# Patient Record
Sex: Male | Born: 1980 | Race: White | Hispanic: No | Marital: Married | Smoking: Former smoker
Health system: Southern US, Community
[De-identification: ages and names within clinical notes are randomized; demographics above are authoritative.]

## PROBLEM LIST (undated history)

## (undated) DIAGNOSIS — Z9889 Other specified postprocedural states: Secondary | ICD-10-CM

## (undated) DIAGNOSIS — R112 Nausea with vomiting, unspecified: Secondary | ICD-10-CM

## (undated) HISTORY — PX: MYRINGOPLASTY W/ FAT GRAFT: SHX2058

## (undated) HISTORY — PX: OTHER SURGICAL HISTORY: SHX169

---

## 2001-07-11 ENCOUNTER — Emergency Department (HOSPITAL_COMMUNITY): Admission: EM | Admit: 2001-07-11 | Discharge: 2001-07-12 | Payer: Self-pay | Admitting: Emergency Medicine

## 2001-07-11 ENCOUNTER — Encounter: Payer: Self-pay | Admitting: Emergency Medicine

## 2005-05-01 ENCOUNTER — Ambulatory Visit (HOSPITAL_COMMUNITY): Admission: RE | Admit: 2005-05-01 | Discharge: 2005-05-01 | Payer: Self-pay | Admitting: Otolaryngology

## 2013-11-12 ENCOUNTER — Emergency Department (HOSPITAL_COMMUNITY): Payer: BC Managed Care – PPO

## 2013-11-12 ENCOUNTER — Emergency Department (HOSPITAL_COMMUNITY)
Admission: EM | Admit: 2013-11-12 | Discharge: 2013-11-12 | Disposition: A | Discharge status: Home / Self Care | Payer: BC Managed Care – PPO | Attending: Emergency Medicine | Admitting: Emergency Medicine

## 2013-11-12 ENCOUNTER — Encounter (HOSPITAL_COMMUNITY): Payer: Self-pay | Admitting: Emergency Medicine

## 2013-11-12 DIAGNOSIS — Z880 Allergy status to penicillin: Secondary | ICD-10-CM | POA: Insufficient documentation

## 2013-11-12 DIAGNOSIS — Y9389 Activity, other specified: Secondary | ICD-10-CM | POA: Insufficient documentation

## 2013-11-12 DIAGNOSIS — Y9289 Other specified places as the place of occurrence of the external cause: Secondary | ICD-10-CM | POA: Insufficient documentation

## 2013-11-12 DIAGNOSIS — S61209A Unspecified open wound of unspecified finger without damage to nail, initial encounter: Secondary | ICD-10-CM | POA: Insufficient documentation

## 2013-11-12 DIAGNOSIS — W312XXA Contact with powered woodworking and forming machines, initial encounter: Secondary | ICD-10-CM | POA: Insufficient documentation

## 2013-11-12 DIAGNOSIS — S61309A Unspecified open wound of unspecified finger with damage to nail, initial encounter: Secondary | ICD-10-CM

## 2013-11-12 MED ORDER — OXYCODONE-ACETAMINOPHEN 7.5-325 MG PO TABS: 1.0000 | ORAL_TABLET | ORAL | Status: DC | PRN

## 2013-11-12 MED ORDER — CEFAZOLIN SODIUM 1 G IJ SOLR
1.0000 g | Freq: Once | INTRAMUSCULAR | Status: AC
  Administered 2013-11-12: 1 g via INTRAMUSCULAR
  Filled 2013-11-12: qty 10

## 2013-11-12 MED ORDER — CEPHALEXIN 500 MG PO CAPS: 500.0000 mg | ORAL_CAPSULE | Freq: Four times a day (QID) | ORAL | Status: DC

## 2013-11-12 MED ORDER — STERILE WATER FOR INJECTION IJ SOLN
INTRAMUSCULAR | Status: AC
  Administered 2013-11-12: 2.1 mL
  Filled 2013-11-12: qty 10

## 2013-11-12 MED ORDER — CEFAZOLIN SODIUM 1-5 GM-% IV SOLN: 1.0000 g | Freq: Once | INTRAVENOUS | Status: DC

## 2013-11-12 MED ORDER — LIDOCAINE HCL (PF) 1 % IJ SOLN
INTRAMUSCULAR | Status: DC
Start: 2013-11-12 — End: 2013-11-12
  Filled 2013-11-12: qty 5

## 2013-11-12 MED ORDER — TETANUS-DIPHTH-ACELL PERTUSSIS 5-2.5-18.5 LF-MCG/0.5 IM SUSP
0.5000 mL | Freq: Once | INTRAMUSCULAR | Status: DC
  Filled 2013-11-12: qty 0.5

## 2013-11-12 NOTE — ED Provider Notes (Signed)
CSN: [id number]     Arrival date & time 11/12/13  1404 History   First MD Initiated Contact with Patient 11/12/13 1416     Chief Complaint  Patient presents with  . Finger Injury     (Consider location/radiation/quality/duration/timing/severity/associated sxs/prior Treatment) Patient is a 33 y.o. male presenting with hand injury. The history is provided by the patient.  Hand Injury Location:  Finger Time since incident:  1 hour Injury: yes   Mechanism of injury comment:  Table saw Finger location:  L index finger Pain details:    Quality:  Dull   Radiates to:  Does not radiate   Severity:  Moderate   Onset quality:  Sudden   Timing:  Constant   Progression:  Unchanged Chronicity:  New Handedness:  Left-handed Dislocation: no   Foreign body present:  Unable to specify Tetanus status:  Up to date Prior injury to area:  No Relieved by:  None tried Worsened by:  Nothing tried Ineffective treatments:  None tried  Devin Vaughan is a 33 y.o. male who presents to the ED with a finger injury. He was using a table saw and his hand slipped and the saw blade caught his left index finger at the tip. Part of the nail was injured. He is left hand dominant. He denies any other injuries. He feels that he is up to date on his tetanus and declined update today.   History reviewed. No pertinent past medical history. Past Surgical History  Procedure Laterality Date  . Myringoplasty w/ fat graft     History reviewed. No pertinent family history. History  Substance Use Topics  . Smoking status: Never Smoker   . Smokeless tobacco: Not on file  . Alcohol Use: No    Review of Systems Negative except as stated in HPI   Allergies  Penicillins  Home Medications   Prior to Admission medications   Not on File   BP 112/62  Pulse 73  Temp(Src) 98.1 F (36.7 C) (Oral)  Resp 17  SpO2 98% Physical Exam  Nursing note and vitals reviewed. Constitutional: He is oriented to person,  place, and time. He appears well-developed and well-nourished.  HENT:  Head: Normocephalic and atraumatic.  Eyes: EOM are normal.  Neck: Neck supple.  Cardiovascular: Normal rate.   Pulmonary/Chest: Effort normal.  Musculoskeletal:       Left hand: He exhibits deformity, laceration and swelling.       Hands: Left index finger with avulsion laceration and partial nail avulsion. Bleeding controlled with pressure. Tender on palpation.   Neurological: He is alert and oriented to person, place, and time. No cranial nerve deficit.  Skin: Skin is warm and dry.  Psychiatric: He has a normal mood and affect. His behavior is normal.    ED Course  Procedures (including critical care time) Verbal consent obtained from the patient.  Discussed procedure risk and benefits  Digital block: Cleaned finger with betadine, using Sensorcaine 0.5 and lidocaine 1% without epi. Digital block of the left index finger with good results.   Wound cleaned with betadine and irrigated with NSS, Jagged skin areas trimmed and edges aligned.   Sutured with 5-0 prolene x 3 sutures to help stop bleeding and to align skin edges.  Technique: Interrupted   Patient toleratedthe procedure without any immediate complications.   Labs Review Labs Reviewed - No data to display Dg Finger Index Left  11/12/2013   CLINICAL DATA:  Skin laceration  EXAM: LEFT INDEX FINGER  2+V  COMPARISON:  None.  FINDINGS: There is no evidence of fracture or dislocation. There is no evidence of arthropathy or other focal bone abnormality. Skin laceration at the distal tip of the index finger.  IMPRESSION: No acute osseous injury of the left index finger.   Electronically Signed   By: Elige KoHetal  Patel   On: 11/12/2013 15:11   Discussed with Dr. Izora Ribasoley and he request we start the patient on Keflex, clean the wound and apply a nonstick dressing and have him call the office on Monday for follow up.   MDM  33 y.o. male left hand dominant with avulsion  laceration of the left index finger that starts at the DIP and goes through the nail with partial nail avulsion. Stable for discharge to follow up with Dr. Izora Ribasoley. Patient will return here sooner for any problems. Xeroform gauze and dressing applied. Discussed with the patient and all questioned fully answered.    Medication List         cephALEXin 500 MG capsule  Commonly known as:  KEFLEX  Take 1 capsule (500 mg total) by mouth 4 (four) times daily.     oxyCODONE-acetaminophen 7.5-325 MG per tablet  Commonly known as:  PERCOCET  Take 1 tablet by mouth every 4 (four) hours as needed for pain.          [place], [place] 11/13/13 1506

## 2013-11-12 NOTE — ED Notes (Addendum)
Pt refused Boostrix. EDNP notified.

## 2013-11-12 NOTE — ED Notes (Signed)
Jagged area with some nail off. Bleeding controlled. Swelling to tip of finger noted

## 2013-11-12 NOTE — ED Notes (Signed)
Cut left index finger on table saw

## 2013-11-12 NOTE — Discharge Instructions (Signed)
Call Dr. Debby Budoley's office on Monday to schedule a follow up appointment. If you have problems before then return here.

## 2013-11-13 NOTE — ED Provider Notes (Signed)
Medical screening examination/treatment/procedure(s) were conducted as a shared visit with non-physician practitioner(s) and myself.  I personally evaluated the patient during the encounter.   EKG Interpretation None     Traumatic injury to left index finger. Wound irrigated and minimally repaired. Referral to hand surgeon  Donnetta HutchingBrian Cook, MD 11/13/13 [id number]

## 2016-03-24 ENCOUNTER — Ambulatory Visit (INDEPENDENT_AMBULATORY_CARE_PROVIDER_SITE_OTHER): Payer: BLUE CROSS/BLUE SHIELD | Admitting: Otolaryngology

## 2016-03-24 DIAGNOSIS — H66011 Acute suppurative otitis media with spontaneous rupture of ear drum, right ear: Secondary | ICD-10-CM

## 2016-03-31 ENCOUNTER — Ambulatory Visit (INDEPENDENT_AMBULATORY_CARE_PROVIDER_SITE_OTHER): Payer: BLUE CROSS/BLUE SHIELD | Admitting: Otolaryngology

## 2016-03-31 DIAGNOSIS — H7121 Cholesteatoma of mastoid, right ear: Secondary | ICD-10-CM

## 2016-03-31 DIAGNOSIS — H9011 Conductive hearing loss, unilateral, right ear, with unrestricted hearing on the contralateral side: Secondary | ICD-10-CM

## 2016-04-08 ENCOUNTER — Other Ambulatory Visit (INDEPENDENT_AMBULATORY_CARE_PROVIDER_SITE_OTHER): Payer: Self-pay | Admitting: Otolaryngology

## 2016-04-08 DIAGNOSIS — H7190 Unspecified cholesteatoma, unspecified ear: Secondary | ICD-10-CM

## 2016-04-14 ENCOUNTER — Ambulatory Visit (HOSPITAL_COMMUNITY)
Admission: RE | Admit: 2016-04-14 | Discharge: 2016-04-14 | Disposition: A | Discharge status: Home / Self Care | Payer: BLUE CROSS/BLUE SHIELD | Source: Ambulatory Visit | Attending: Otolaryngology | Admitting: Otolaryngology

## 2016-04-14 ENCOUNTER — Ambulatory Visit (INDEPENDENT_AMBULATORY_CARE_PROVIDER_SITE_OTHER): Payer: BLUE CROSS/BLUE SHIELD | Admitting: Otolaryngology

## 2016-04-14 DIAGNOSIS — H7190 Unspecified cholesteatoma, unspecified ear: Secondary | ICD-10-CM | POA: Diagnosis present

## 2016-04-14 DIAGNOSIS — H7121 Cholesteatoma of mastoid, right ear: Secondary | ICD-10-CM

## 2016-04-14 DIAGNOSIS — H9011 Conductive hearing loss, unilateral, right ear, with unrestricted hearing on the contralateral side: Secondary | ICD-10-CM | POA: Diagnosis not present

## 2016-04-14 DIAGNOSIS — H95199 Other disorders following mastoidectomy, unspecified ear: Secondary | ICD-10-CM | POA: Diagnosis not present

## 2016-04-29 ENCOUNTER — Encounter (HOSPITAL_BASED_OUTPATIENT_CLINIC_OR_DEPARTMENT_OTHER): Payer: Self-pay | Admitting: *Deleted

## 2016-05-01 ENCOUNTER — Ambulatory Visit (INDEPENDENT_AMBULATORY_CARE_PROVIDER_SITE_OTHER): Payer: BLUE CROSS/BLUE SHIELD | Admitting: Otolaryngology

## 2016-05-01 DIAGNOSIS — H7121 Cholesteatoma of mastoid, right ear: Secondary | ICD-10-CM | POA: Diagnosis not present

## 2016-05-01 DIAGNOSIS — H7011 Chronic mastoiditis, right ear: Secondary | ICD-10-CM

## 2016-05-02 ENCOUNTER — Other Ambulatory Visit: Payer: Self-pay | Admitting: Otolaryngology

## 2016-05-05 ENCOUNTER — Encounter (HOSPITAL_BASED_OUTPATIENT_CLINIC_OR_DEPARTMENT_OTHER): Payer: Self-pay

## 2016-05-05 ENCOUNTER — Encounter (HOSPITAL_BASED_OUTPATIENT_CLINIC_OR_DEPARTMENT_OTHER)
Admission: RE | Disposition: A | Discharge status: Home / Self Care | Payer: Self-pay | Source: Ambulatory Visit | Attending: Otolaryngology

## 2016-05-05 ENCOUNTER — Ambulatory Visit (HOSPITAL_BASED_OUTPATIENT_CLINIC_OR_DEPARTMENT_OTHER): Payer: BLUE CROSS/BLUE SHIELD | Admitting: Anesthesiology

## 2016-05-05 ENCOUNTER — Encounter (HOSPITAL_BASED_OUTPATIENT_CLINIC_OR_DEPARTMENT_OTHER): Payer: Self-pay | Admitting: Anesthesiology

## 2016-05-05 ENCOUNTER — Ambulatory Visit (HOSPITAL_BASED_OUTPATIENT_CLINIC_OR_DEPARTMENT_OTHER)
Admission: RE | Admit: 2016-05-05 | Discharge: 2016-05-05 | Disposition: A | Discharge status: Home / Self Care | Payer: BLUE CROSS/BLUE SHIELD | Source: Ambulatory Visit | Attending: Otolaryngology | Admitting: Otolaryngology

## 2016-05-05 ENCOUNTER — Ambulatory Visit (HOSPITAL_BASED_OUTPATIENT_CLINIC_OR_DEPARTMENT_OTHER): Admit: 2016-05-05 | Payer: Self-pay | Admitting: Otolaryngology

## 2016-05-05 DIAGNOSIS — H9011 Conductive hearing loss, unilateral, right ear, with unrestricted hearing on the contralateral side: Secondary | ICD-10-CM | POA: Insufficient documentation

## 2016-05-05 DIAGNOSIS — H7191 Unspecified cholesteatoma, right ear: Secondary | ICD-10-CM | POA: Diagnosis not present

## 2016-05-05 DIAGNOSIS — H7011 Chronic mastoiditis, right ear: Secondary | ICD-10-CM | POA: Insufficient documentation

## 2016-05-05 DIAGNOSIS — Z87891 Personal history of nicotine dependence: Secondary | ICD-10-CM | POA: Insufficient documentation

## 2016-05-05 DIAGNOSIS — H7121 Cholesteatoma of mastoid, right ear: Secondary | ICD-10-CM | POA: Diagnosis not present

## 2016-05-05 HISTORY — PX: TYMPANOMASTOIDECTOMY: SHX34

## 2016-05-05 HISTORY — DX: Nausea with vomiting, unspecified: Z98.890

## 2016-05-05 HISTORY — DX: Other specified postprocedural states: R11.2

## 2016-05-05 SURGERY — TYMPANOPLASTY, WITH MASTOIDECTOMY
Anesthesia: General | Laterality: Right

## 2016-05-05 SURGERY — TYMPANOPLASTY, WITH MASTOIDECTOMY
Anesthesia: General | Site: Ear | Laterality: Right

## 2016-05-05 MED ORDER — MIDAZOLAM HCL 2 MG/2ML IJ SOLN
1.0000 mg | INTRAMUSCULAR | Status: DC | PRN
  Administered 2016-05-05: 2 mg via INTRAVENOUS

## 2016-05-05 MED ORDER — SUCCINYLCHOLINE CHLORIDE 20 MG/ML IJ SOLN
INTRAMUSCULAR | Status: DC | PRN
  Administered 2016-05-05: 100 mg via INTRAVENOUS

## 2016-05-05 MED ORDER — OXYCODONE-ACETAMINOPHEN 5-325 MG PO TABS: 1.0000 | ORAL_TABLET | ORAL | 0 refills | Status: AC | PRN

## 2016-05-05 MED ORDER — BACITRACIN ZINC 500 UNIT/GM EX OINT
TOPICAL_OINTMENT | CUTANEOUS | Status: DC | PRN
  Administered 2016-05-05: 1 via TOPICAL

## 2016-05-05 MED ORDER — LACTATED RINGERS IV SOLN
INTRAVENOUS | Status: DC
  Administered 2016-05-05: 11:00:00 via INTRAVENOUS
  Administered 2016-05-05: 10 mL/h via INTRAVENOUS

## 2016-05-05 MED ORDER — HYDROMORPHONE HCL 1 MG/ML IJ SOLN
0.2500 mg | INTRAMUSCULAR | Status: DC | PRN
  Administered 2016-05-05 (×2): 0.5 mg via INTRAVENOUS

## 2016-05-05 MED ORDER — FENTANYL CITRATE (PF) 100 MCG/2ML IJ SOLN
50.0000 ug | INTRAMUSCULAR | Status: DC | PRN
Start: 2016-05-05 — End: 2016-05-05
  Administered 2016-05-05: 100 ug via INTRAVENOUS

## 2016-05-05 MED ORDER — LIDOCAINE 2% (20 MG/ML) 5 ML SYRINGE
INTRAMUSCULAR | Status: AC
  Filled 2016-05-05: qty 5

## 2016-05-05 MED ORDER — EPHEDRINE 5 MG/ML INJ
INTRAVENOUS | Status: AC
  Filled 2016-05-05: qty 10

## 2016-05-05 MED ORDER — ONDANSETRON HCL 4 MG/2ML IJ SOLN
INTRAMUSCULAR | Status: AC
  Filled 2016-05-05: qty 2

## 2016-05-05 MED ORDER — SCOPOLAMINE 1 MG/3DAYS TD PT72: 1.0000 | MEDICATED_PATCH | Freq: Once | TRANSDERMAL | Status: DC | PRN

## 2016-05-05 MED ORDER — DEXAMETHASONE SODIUM PHOSPHATE 10 MG/ML IJ SOLN
INTRAMUSCULAR | Status: AC
  Filled 2016-05-05: qty 1

## 2016-05-05 MED ORDER — DEXAMETHASONE SODIUM PHOSPHATE 4 MG/ML IJ SOLN
INTRAMUSCULAR | Status: DC | PRN
  Administered 2016-05-05: 10 mg via INTRAVENOUS

## 2016-05-05 MED ORDER — SUCCINYLCHOLINE CHLORIDE 200 MG/10ML IV SOSY
PREFILLED_SYRINGE | INTRAVENOUS | Status: AC
  Filled 2016-05-05: qty 10

## 2016-05-05 MED ORDER — CIPROFLOXACIN-FLUOCINOLONE PF 0.3-0.025 % OT SOLN
OTIC | Status: DC | PRN
  Administered 2016-05-05: 2.25 mL via OTIC

## 2016-05-05 MED ORDER — SODIUM CHLORIDE 0.9 % IR SOLN
Status: DC | PRN
  Administered 2016-05-05: 150 mL

## 2016-05-05 MED ORDER — LIDOCAINE HCL (CARDIAC) 20 MG/ML IV SOLN
INTRAVENOUS | Status: DC | PRN
  Administered 2016-05-05: 50 mg via INTRAVENOUS

## 2016-05-05 MED ORDER — PROPOFOL 10 MG/ML IV BOLUS
INTRAVENOUS | Status: DC | PRN
  Administered 2016-05-05: 200 mg via INTRAVENOUS

## 2016-05-05 MED ORDER — EPINEPHRINE 30 MG/30ML IJ SOLN
INTRAMUSCULAR | Status: AC
  Filled 2016-05-05: qty 1

## 2016-05-05 MED ORDER — MIDAZOLAM HCL 2 MG/2ML IJ SOLN
INTRAMUSCULAR | Status: AC
  Filled 2016-05-05: qty 2

## 2016-05-05 MED ORDER — OXYCODONE HCL 5 MG/5ML PO SOLN: 5.0000 mg | Freq: Once | ORAL | Status: DC | PRN

## 2016-05-05 MED ORDER — CLINDAMYCIN PHOSPHATE 600 MG/50ML IV SOLN
INTRAVENOUS | Status: AC
  Filled 2016-05-05: qty 50

## 2016-05-05 MED ORDER — FENTANYL CITRATE (PF) 100 MCG/2ML IJ SOLN
INTRAMUSCULAR | Status: AC
  Filled 2016-05-05: qty 2

## 2016-05-05 MED ORDER — OXYMETAZOLINE HCL 0.05 % NA SOLN
NASAL | Status: AC
  Filled 2016-05-05: qty 15

## 2016-05-05 MED ORDER — LIDOCAINE HCL (PF) 1 % IJ SOLN
INTRAMUSCULAR | Status: DC | PRN
  Administered 2016-05-05: 3 mL

## 2016-05-05 MED ORDER — LIDOCAINE HCL (PF) 1 % IJ SOLN
INTRAMUSCULAR | Status: AC
  Filled 2016-05-05: qty 30

## 2016-05-05 MED ORDER — HYDROMORPHONE HCL 1 MG/ML IJ SOLN
INTRAMUSCULAR | Status: AC
  Filled 2016-05-05: qty 1

## 2016-05-05 MED ORDER — CLINDAMYCIN PHOSPHATE 600 MG/50ML IV SOLN
INTRAVENOUS | Status: DC | PRN
  Administered 2016-05-05: 600 mg via INTRAVENOUS

## 2016-05-05 MED ORDER — OXYCODONE HCL 5 MG PO TABS: 5.0000 mg | ORAL_TABLET | Freq: Once | ORAL | Status: DC | PRN

## 2016-05-05 MED ORDER — EPHEDRINE SULFATE 50 MG/ML IJ SOLN
INTRAMUSCULAR | Status: DC | PRN
  Administered 2016-05-05 (×3): 10 mg via INTRAVENOUS

## 2016-05-05 MED ORDER — EPINEPHRINE PF 1 MG/ML IJ SOLN
INTRAMUSCULAR | Status: DC | PRN
  Administered 2016-05-05: 4 mL

## 2016-05-05 MED ORDER — CLINDAMYCIN HCL 300 MG PO CAPS: 300.0000 mg | ORAL_CAPSULE | Freq: Three times a day (TID) | ORAL | 0 refills | Status: AC

## 2016-05-05 MED ORDER — BACITRACIN ZINC 500 UNIT/GM EX OINT
TOPICAL_OINTMENT | CUTANEOUS | Status: AC
  Filled 2016-05-05: qty 28.35

## 2016-05-05 MED ORDER — ONDANSETRON HCL 4 MG/2ML IJ SOLN
INTRAMUSCULAR | Status: DC | PRN
  Administered 2016-05-05: 4 mg via INTRAVENOUS

## 2016-05-05 MED ORDER — ONDANSETRON HCL 4 MG/2ML IJ SOLN: 4.0000 mg | Freq: Four times a day (QID) | INTRAMUSCULAR | Status: DC | PRN

## 2016-05-05 SURGICAL SUPPLY — 62 items
BLADE CLIPPER SURG (BLADE) ×3 IMPLANT
BLADE NEEDLE 3 SS STRL (BLADE) IMPLANT
BLADE NEEDLE 3MM SS STRL (BLADE)
BUR RND OSTEON ELITE 6.0 (BURR) ×2 IMPLANT
BUR RND OSTEON ELITE 6.0MM (BURR) ×1
BUR SURG RND 4.0 COARSE DIAM (BURR) ×3 IMPLANT
CANISTER SUCT 1200ML W/VALVE (MISCELLANEOUS) ×3 IMPLANT
CORDS BIPOLAR (ELECTRODE) ×3 IMPLANT
COTTONBALL LRG STERILE PKG (GAUZE/BANDAGES/DRESSINGS) ×3 IMPLANT
DECANTER SPIKE VIAL GLASS SM (MISCELLANEOUS) IMPLANT
DERMABOND ADVANCED (GAUZE/BANDAGES/DRESSINGS) ×2
DERMABOND ADVANCED .7 DNX12 (GAUZE/BANDAGES/DRESSINGS) ×1 IMPLANT
DRAPE MICROSCOPE WILD 40.5X102 (DRAPES) ×3 IMPLANT
DRAPE SURG 17X23 STRL (DRAPES) ×3 IMPLANT
DRAPE SURG IRRIG POUCH 19X23 (DRAPES) ×3 IMPLANT
DRSG GLASSCOCK MASTOID ADT (GAUZE/BANDAGES/DRESSINGS) ×3 IMPLANT
DRSG GLASSCOCK MASTOID PED (GAUZE/BANDAGES/DRESSINGS) IMPLANT
ELECT COATED BLADE 2.86 ST (ELECTRODE) ×3 IMPLANT
ELECT PAIRED SUBDERMAL (MISCELLANEOUS) ×3
ELECT REM PT RETURN 9FT ADLT (ELECTROSURGICAL) ×3
ELECTRODE PAIRED SUBDERMAL (MISCELLANEOUS) ×1 IMPLANT
ELECTRODE REM PT RTRN 9FT ADLT (ELECTROSURGICAL) ×1 IMPLANT
GAUZE SPONGE 4X4 12PLY STRL (GAUZE/BANDAGES/DRESSINGS) IMPLANT
GAUZE SPONGE 4X4 16PLY XRAY LF (GAUZE/BANDAGES/DRESSINGS) ×3 IMPLANT
GLOVE BIO SURGEON STRL SZ7.5 (GLOVE) ×3 IMPLANT
GLOVE EXAM NITRILE EXT CUFF MD (GLOVE) ×3 IMPLANT
GLOVE SURG SS PI 7.0 STRL IVOR (GLOVE) ×3 IMPLANT
GOWN STRL REUS W/ TWL LRG LVL3 (GOWN DISPOSABLE) ×2 IMPLANT
GOWN STRL REUS W/TWL LRG LVL3 (GOWN DISPOSABLE) ×4
HEMOSTAT SURGICEL .5X2 ABSORB (HEMOSTASIS) IMPLANT
HEMOSTAT SURGICEL 2X14 (HEMOSTASIS) IMPLANT
IV CATH AUTO 14GX1.75 SAFE ORG (IV SOLUTION) ×3 IMPLANT
IV NS 500ML (IV SOLUTION) ×2
IV NS 500ML BAXH (IV SOLUTION) ×1 IMPLANT
IV SET EXT 30 76VOL 4 MALE LL (IV SETS) ×3 IMPLANT
NDL SAFETY ECLIPSE 18X1.5 (NEEDLE) ×1 IMPLANT
NEEDLE FILTER BLUNT 18X 1/2SAF (NEEDLE) ×2
NEEDLE FILTER BLUNT 18X1 1/2 (NEEDLE) ×1 IMPLANT
NEEDLE HYPO 18GX1.5 SHARP (NEEDLE) ×2
NEEDLE HYPO 25X1 1.5 SAFETY (NEEDLE) ×3 IMPLANT
NS IRRIG 1000ML POUR BTL (IV SOLUTION) ×3 IMPLANT
PACK AMBRUS EAR (MISCELLANEOUS) ×3 IMPLANT
PACK BASIN DAY SURGERY FS (CUSTOM PROCEDURE TRAY) ×3 IMPLANT
PACK ENT DAY SURGERY (CUSTOM PROCEDURE TRAY) ×3 IMPLANT
PATTIES SURGICAL .5 X3 (DISPOSABLE) ×3 IMPLANT
PENCIL BUTTON HOLSTER BLD 10FT (ELECTRODE) ×3 IMPLANT
PROBE NERVBE PRASS .33 (MISCELLANEOUS) IMPLANT
SLEEVE SCD COMPRESS KNEE MED (MISCELLANEOUS) ×3 IMPLANT
SPONGE GAUZE 4X4 12PLY STER LF (GAUZE/BANDAGES/DRESSINGS) ×6 IMPLANT
SPONGE SURGIFOAM ABS GEL 12-7 (HEMOSTASIS) ×3 IMPLANT
SUT BONE WAX W31G (SUTURE) ×3 IMPLANT
SUT VIC AB 3-0 SH 27 (SUTURE)
SUT VIC AB 3-0 SH 27X BRD (SUTURE) IMPLANT
SUT VIC AB 4-0 P-3 18XBRD (SUTURE) IMPLANT
SUT VIC AB 4-0 P3 18 (SUTURE)
SUT VICRYL 4-0 PS2 18IN ABS (SUTURE) ×3 IMPLANT
SYR 5ML LL (SYRINGE) ×3 IMPLANT
SYR BULB 3OZ (MISCELLANEOUS) ×3 IMPLANT
TOWEL OR 17X24 6PK STRL BLUE (TOWEL DISPOSABLE) ×3 IMPLANT
TOWEL OR NON WOVEN STRL DISP B (DISPOSABLE) ×3 IMPLANT
TRAY DSU PREP LF (CUSTOM PROCEDURE TRAY) ×3 IMPLANT
TUBING IRRIGATION (MISCELLANEOUS) ×3 IMPLANT

## 2016-05-05 NOTE — Anesthesia Preprocedure Evaluation (Signed)
Anesthesia Evaluation  Patient identified by MRN, date of birth, ID band Patient awake    Reviewed: Allergy & Precautions, H&P , NPO status , Patient's Chart, lab work & pertinent test results  History of Anesthesia Complications (+) PONV and history of anesthetic complications  Airway Mallampati: II   Neck ROM: full    Dental   Pulmonary former smoker,    breath sounds clear to auscultation       Cardiovascular negative cardio ROS   Rhythm:regular Rate:Normal     Neuro/Psych    GI/Hepatic   Endo/Other    Renal/GU      Musculoskeletal   Abdominal   Peds  Hematology   Anesthesia Other Findings   Reproductive/Obstetrics                             Anesthesia Physical Anesthesia Plan  ASA: II  Anesthesia Plan: General   Post-op Pain Management:    Induction: Intravenous  Airway Management Planned: Oral ETT  Additional Equipment:   Intra-op Plan:   Post-operative Plan: Extubation in OR  Informed Consent: I have reviewed the patients History and Physical, chart, labs and discussed the procedure including the risks, benefits and alternatives for the proposed anesthesia with the patient or authorized representative who has indicated his/her understanding and acceptance.     Plan Discussed with: CRNA, Anesthesiologist and Surgeon  Anesthesia Plan Comments:         Anesthesia Quick Evaluation

## 2016-05-05 NOTE — Anesthesia Procedure Notes (Signed)
Procedure Name: Intubation Date/Time: 05/05/2016 10:08 AM Performed by: Caren MacadamARTER, Devin Vaughan Pre-anesthesia Checklist: Patient identified, Emergency Drugs available, Suction available and Patient being monitored Patient Re-evaluated:Patient Re-evaluated prior to inductionOxygen Delivery Method: Circle system utilized Preoxygenation: Pre-oxygenation with 100% oxygen Intubation Type: IV induction Ventilation: Mask ventilation without difficulty Laryngoscope Size: Miller and 2 Grade View: Grade I Tube type: Oral Tube size: 7.0 mm Number of attempts: 1 Airway Equipment and Method: Stylet and Oral airway Placement Confirmation: ETT inserted through vocal cords under direct vision,  positive ETCO2 and breath sounds checked- equal and bilateral Secured at: 23 cm Tube secured with: Tape Dental Injury: Teeth and Oropharynx as per pre-operative assessment

## 2016-05-05 NOTE — H&P (Signed)
Cc: Recurrent right ear cholesteatoma and otomastoiditis  HPI: The patient is a 35 year old male who returns today for his follow-up evaluation.  The patient was last seen 2 weeks ago.  At that time, he was noted to have persistent right ear drainage. Recurrent cholesteatoma was noted within the right ear.  The patient was treated with debridement and Otovel eardrops.  He also underwent a temporal bone CT scan.  The CT shows likely cholesteatoma in the mastoid and right middle ear space, with possible dehiscence of the horizontal semicircular canal and the tegmen tympani.  The right incus and stapes are missing, likely secondary to cholesteatoma erosion. The patient returns today complaining of persistent right ear drainage.  He denies any significant otalgia.  He continues to have difficulty hearing on the right side. No other ENT, GI, or respiratory issue noted since the last visit.   Exam General: Communicates without difficulty, well nourished, no acute distress. Head: Normocephalic, no evidence injury, no tenderness, facial buttresses intact without stepoff. Eyes: PERRL, EOMI. No scleral icterus, conjunctivae clear. Neuro: CN II exam reveals vision grossly intact. No nystagmus at any point of gaze. Ears: Auricles well formed without lesions. Purulent drainage is noted on the right.  Erosion of the canal wall is noted with recurrent cholesteatoma. Nose: External evaluation reveals normal support and skin without lesions. Dorsum is intact. Anterior rhinoscopy reveals healthy pink mucosa over anterior aspect of inferior turbinates and intact septum. No purulence noted. Oral:  Oral cavity and oropharynx are intact, symmetric, without erythema or edema. Mucosa is moist without lesions. Neck: Full range of motion without pain. There is no significant lymphadenopathy. No masses palpable. Thyroid bed within normal limits to palpation. Parotid glands and submandibular glands equal bilaterally without mass. Trachea  is midline. Neuro:  CN 2-12 grossly intact. Gait normal. Vestibular: No nystagmus at any point of gaze. The cerebellar examination is unremarkable.   Assessment 1.  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  2.  Persistent chronic otomastoiditis, secondary to the right ear cholesteatoma.   Plan 1.  The physical exam findings and the CT images are reviewed with the patient.  2.  Based on the above findings, the patient will benefit from undergoing canal wall down tympanomastoidectomy procedure to completely remove the recurrent cholesteatoma.  A large meatoplasty will also be beneficial.   3.  The risks, benefits, alternatives and details of the surgery are extensively discussed.  4.  The patient would like to proceed with the procedure.

## 2016-05-05 NOTE — Op Note (Signed)
DATE OF PROCEDURE: 05/05/2016  SURGEON: Newman PiesSu Jaysten Essner, MD  OPERATIVE REPORT   PREOPERATIVE DIAGNOSIS:  1. Right ear cholesteatoma 2. Right chronic otomastoiditis 3. Right ear conductive hearing loss  POSTOPERATIVE DIAGNOSIS:  1. Right ear cholesteatoma 2. Right chronic otomastoiditis 3. Right ear conductive hearing loss  PROCEDURES PERFORMED: 1. Right radical canal-wall-down tympanomastoidectomy  ANESTHESIA: General endotracheal tube anesthesia.  COMPLICATIONS: None.  ESTIMATED BLOOD LOSS: 100ml  INDICATION FOR PROCEDURE: Devin Vaughan is a 35 y.o. male with a history of right ear otomastoiditis and right ear cholesteatoma. He previously underwent right tympanomastoidectomy surgery more than 10 years ago. Since the surgery, he continued to have intermittent drainage from the right ear. At his initial evaluation 2 months ago, he was noted to have right chronic otomastoiditis and recurrent cholesteatoma. He was previously treated with multiple courses of oral and topical antibiotics. However, he continued to have persistent right ear drainage. On his CT scan, he was noted to have significant amount of recurrent right ear cholesteatoma, with possible erosion of the right ear tegmen tympani and the horizontal semicircular canal. He was also noted to have asymmetric right ear conductive hearing loss. Based on the above findings, the decision was made for patient to undergo the above-stated procedure. The risks, benefits, alternatives, and details of the procedures were discussed with the patient. Questions were invited and answered. Informed consent was obtained.  DESCRIPTION OF PROCEDURE: The patient was taken to the operating room and placed supine on the operating table. General endotracheal tube anesthesia was induced by the anesthesiologist.The patient was positioned and prepped and draped in a standard fashion for right ear surgery. Facial nerve monitoring electrodes were  placed. The facial nerve monitoring system was functional throughout the case.  1% lidocaine with 1-100,000 epinephrine was infiltrated into the right postauricular crease. Under the operating microscope, the right ear canal was cleaned of all cerumen. A large amount of cholesteatoma was noted within the right ear.   A standard postauricular incision was made. The soft tissue covering the mastoid cortex was carefully elevated. A 2 x 2 centimeters temporalis fascia graft was harvested in the standard fashion. Using a #6 cutting bur, a standard CWD cortical mastoidectomy procedure was performed. A portion of the posterior canal wall was previously taken now. A small area of the tegmen tympani was noted to be dehiscent, exposing the dura. In addition, a small area over the right horizontal semicircular canal was also noted to be dehiscent.  The horizontal segment of the facial nerve was noted to be exposed. All cholesteatoma was removed. The posterior canal wall was taken down to the level of the facial nerve.  The sigmoid sinus were identified and preserved. No middle ear ossicle was noted. The mastoid and middle ear space were copiously irrigated. The previously harvested temporalis fascia graft was used to reconstruct a new tympanum. Gelfoam soaked with otovel solution was used to pack the middle ear space and the mastoid cavity.  Attention was then focused on the meatoplasty portion of the case. A 6:00 and 12:00 incision was made. The posterior concha cartilage was removed. The posterior soft tissue flap was then sutured to the posterior aspect of the mastoid cavity. A large meatal opening was achieved.  The postauricular incision was then closed in layers with 4-0 Vicryl and Dermabond. A Glasscock dressing was applied. That concluded the procedure for the patient. The care of the patient was turned over to the anesthesiologist. The patient was awakened from anesthesia without difficulty. He was  extubated and transferred to the recovery room in good condition.  OPERATIVE FINDINGS: Recurrent cholesteatoma, with dehiscence of the tegmen tympani, horizontal segment of the facial nerve, and the horizontal semicircular canal.  All cholesteatoma was removed. The facial nerve and the dehiscent semicircular canal were covered with a layer of temporalis fascia.  SPECIMEN: Right middle ear and mastoid contents  FOLLOWUP CARE: The patient will be discharged home once he is awake and alert. He will follow up in my office in 1 week.

## 2016-05-05 NOTE — Discharge Instructions (Addendum)
Post Anesthesia Home Care Instructions  Activity: Get plenty of rest for the remainder of the day. A responsible adult should stay with you for 24 hours following the procedure.  For the next 24 hours, DO NOT: -Drive a car -Advertising copywriterperate machinery -Drink alcoholic beverages -Take any medication unless instructed by your physician -Make any legal decisions or sign important papers.  Meals: Start with liquid foods such as gelatin or soup. Progress to regular foods as tolerated. Avoid greasy, spicy, heavy foods. If nausea and/or vomiting occur, drink only clear liquids until the nausea and/or vomiting subsides. Call your physician if vomiting continues.  Special Instructions/Symptoms: Your throat may feel dry or sore from the anesthesia or the breathing tube placed in your throat during surgery. If this causes discomfort, gargle with warm salt water. The discomfort should disappear within 24 hours.  If you had a scopolamine patch placed behind your ear for the management of post- operative nausea and/or vomiting:  1. The medication in the patch is effective for 72 hours, after which it should be removed.  Wrap patch in a tissue and discard in the trash. Wash hands thoroughly with soap and water. 2. You may remove the patch earlier than 72 hours if you experience unpleasant side effects which may include dry mouth, dizziness or visual disturbances. 3. Avoid touching the patch. Wash your hands with soap and water after contact with the patch.  ------------------  POSTOPERATIVE INSTRUCTIONS FOR PATIENTS HAVING MASTOIDECTOMY SURGERY  1. You may have nausea, vomiting, or a low grade fever for a few days after surgery. This is not unusual.  However, if the nausea and vomiting become severe or last more than one day, please call our office. Medication for nausea may be prescribed. You may take Tylenol every four hours for fever. If your fever should rise above 101 F, please contact our office. 2. Limit  your activities for one week. This includes avoiding heavy lifting (over 20lbs), vigorous exercise, and contact sports. 3. Do not blow your nose for approximately one week.  Any accumulation in the nose should be drawn back into the throat and expectorated through the mouth to avoid infecting the ear. If it is necessary to sneeze, do so with your mouth open to decrease pressure to your ears. Do not hold your nose to avoid sneezing.  4. You may wash your hair 2 days after the operation. Please protect the ear and any external incision from water. We recommend placing some plastic wrap over the ear and incision to help protect against water. It may be necessary to have someone help you during the first several washings.  5. Try to keep the incision clean and dry. You should clean crust from the incisional area with diluted hydrogen peroxide. Any time you are going to clean your ear, please wash your hands thoroughly prior to starting. 6. Some dull postoperative ear pain is expected. Your physician may prescribe pain medicine to help relieve your discomfort. If your postoperative pain increases and your medication is not helping, please call the office before taking any other medication that we have not prescribed or recommended. 7. If any of the following should occur, contact Dr.Garnett Nunziata:   (Office: [phone])) a. Persistent bleeding  b. Persistent fever c. Purulent drainage (pus) from the ear or incision d. Increasing redness around the suture line e. Persistent pain or dizziness f. Facial weakness 8. Sometimes, with a larger incision behind the ear, the incision may open and drain. If it occurs, please contact our office. 9. If your physician prescribes an antibiotic, fill the prescription promptly and take all of the medicine as directed until the entire supply is gone. 10.  You may experience some popping and cracking sounds in the ear for  up to several weeks. It may sound like you are talking in a barrel or a tunnel. This is normal and should not cause concern. 11. Because a nerve for taste passes through your ear, it is not unusual for your taste sensation to be altered for several weeks or months. 12. You may experience some numbness in your outer ear, earlobe, and the incision area. This is normal, and most of the numbness will be expected to fade over a period of time.                                                               13. Your eardrum may look pink or red for up to a month postoperatively. The red coloration is due to fluid in the middle ear. The change in color should not be confused with infection.  14. It is important to return to our office for your postoperative appointment as scheduled. If for some reason you were not given a postoperative appointment, please call our office at [phone].   ---------------------------   Introduction __[name]___ needs to be excused from: _x___ Work ____ School ____ Physical activity beginning now and through the following date: _12/25/2017___. He or she may return to work without restriction on  _12/26/2017_______________.   Health Care Provider : __Su Philomena DohenyWooi Kenslei Hearty, MD______________________________________ Date: _12/11/2017_______________ This information is not intended to replace advice given to you by your health care provider. Make sure you discuss any questions you have with your health care provider. Document Released: 11/05/2000 Document Revised: 11/30/2015 Document Reviewed: 12/12/2013  2017 [employer]

## 2016-05-05 NOTE — Transfer of Care (Signed)
Immediate Anesthesia Transfer of Care Note  Patient: Devin Vaughan  Procedure(s) Performed: Procedure(s): RIGHT CANAL WALL DOWN TYMPANOMASTOIDECTOMY (Right)  Patient Location: PACU  Anesthesia Type:General  Level of Consciousness: awake and alert   Airway & Oxygen Therapy: Patient Spontanous Breathing and Patient connected to face mask oxygen  Post-op Assessment: Report given to RN and Post -op Vital signs reviewed and stable  Post vital signs: Reviewed and stable  Last Vitals:  Vitals:   05/05/16 0835  BP: (!) 117/59  Pulse: 71  Resp: 16  Temp: 36.4 C    Last Pain:  Vitals:   05/05/16 0835  TempSrc: Oral         Complications: No apparent anesthesia complications

## 2016-05-06 ENCOUNTER — Encounter (HOSPITAL_BASED_OUTPATIENT_CLINIC_OR_DEPARTMENT_OTHER): Payer: Self-pay | Admitting: Otolaryngology

## 2016-05-06 NOTE — Anesthesia Postprocedure Evaluation (Signed)
Anesthesia Post Note  Patient: Devin Vaughan  Procedure(s) Performed: Procedure(s) (LRB): RIGHT CANAL WALL DOWN TYMPANOMASTOIDECTOMY (Right)  Patient location during evaluation: PACU Anesthesia Type: General Level of consciousness: awake and alert and patient cooperative Pain management: pain level controlled Vital Signs Assessment: post-procedure vital signs reviewed and stable Respiratory status: spontaneous breathing and respiratory function stable Cardiovascular status: stable Anesthetic complications: no    Last Vitals:  Vitals:   05/05/16 1300 05/05/16 1330  BP: 124/72 120/70  Pulse: 69 70  Resp: 12 20  Temp:  36.7 C    Last Pain:  Vitals:   05/05/16 1330  TempSrc:   PainSc: 1                  Meghan Warshawsky S

## 2016-05-12 ENCOUNTER — Ambulatory Visit (INDEPENDENT_AMBULATORY_CARE_PROVIDER_SITE_OTHER): Payer: BLUE CROSS/BLUE SHIELD | Admitting: Otolaryngology

## 2016-05-29 ENCOUNTER — Ambulatory Visit (INDEPENDENT_AMBULATORY_CARE_PROVIDER_SITE_OTHER): Payer: BLUE CROSS/BLUE SHIELD | Admitting: Otolaryngology

## 2016-08-18 ENCOUNTER — Ambulatory Visit (INDEPENDENT_AMBULATORY_CARE_PROVIDER_SITE_OTHER): Payer: BLUE CROSS/BLUE SHIELD | Admitting: Otolaryngology

## 2016-08-18 DIAGNOSIS — H7011 Chronic mastoiditis, right ear: Secondary | ICD-10-CM | POA: Diagnosis not present

## 2016-09-15 ENCOUNTER — Ambulatory Visit (INDEPENDENT_AMBULATORY_CARE_PROVIDER_SITE_OTHER): Payer: Self-pay | Admitting: Otolaryngology

## 2017-02-17 IMAGING — CT CT TEMPORAL BONES W/O CM
2 of 6 series · 13 of 40 positions shown, 16 images · non-contrast
Comparison: 05/01/2005

CLINICAL DATA: Right-sided ear drainage for 1 year. History of
surgery for cholesteatoma.

EXAM:
CT TEMPORAL BONES WITHOUT CONTRAST
TECHNIQUE: Axial and coronal plane CT imaging of the petrous temporal bones was
performed with thin-collimation image reconstruction. No intravenous
contrast was administered. Multiplanar CT image reconstructions were
also generated.

[Series 4: coronal bone. · coronal · 0.18mm/px · 2 of 235 slices shown]
[im 79/235  bone]
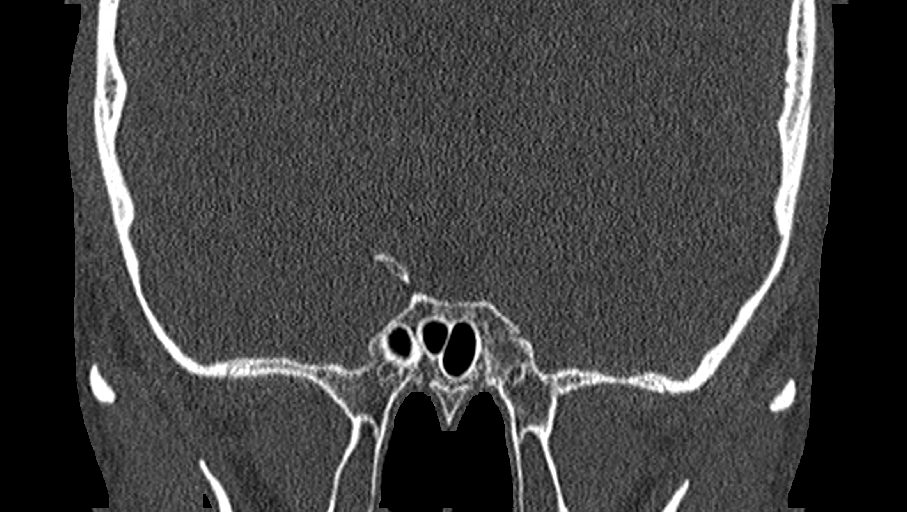
[im 157/235  bone]
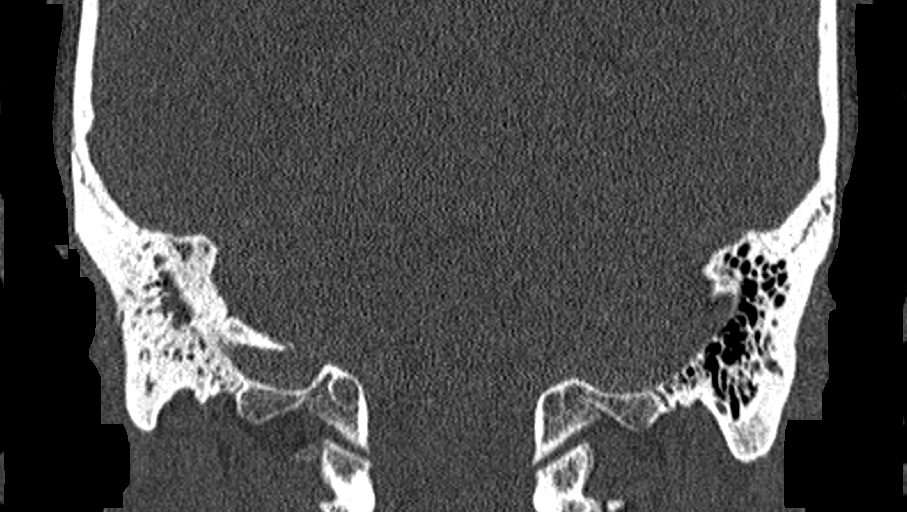

[Series 5: ax mag right · axial · 0.17mm/px · z∈[+1603,+1665]mm · 11 of 125 slices shown, 14 images]
[im 11/125  brain]
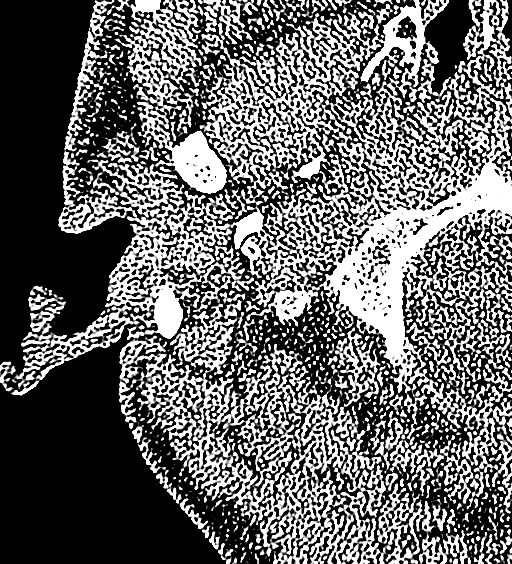
[im 11/125  bone]
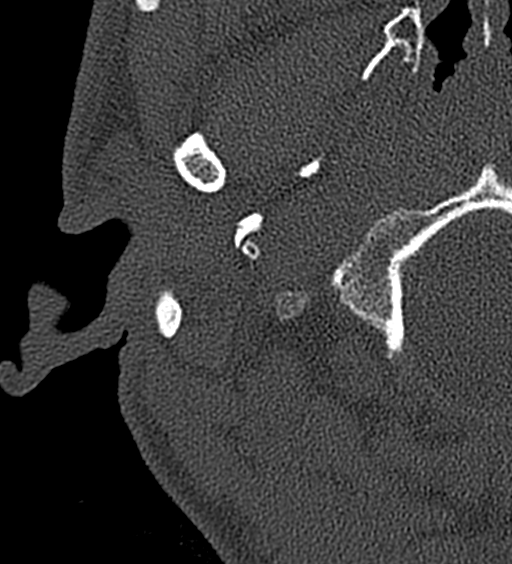
[im 21/125  bone]
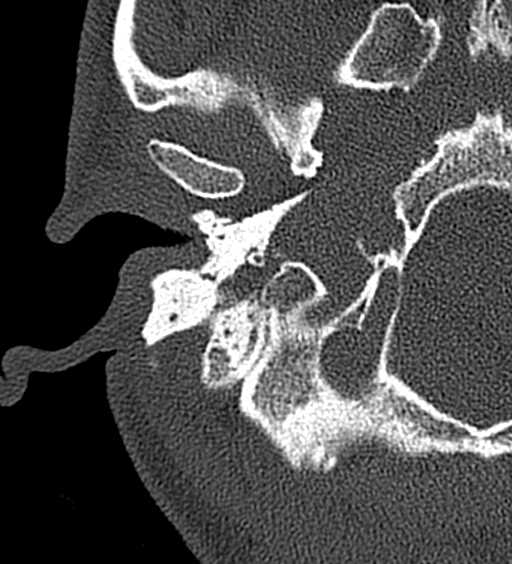
[im 32/125  bone]
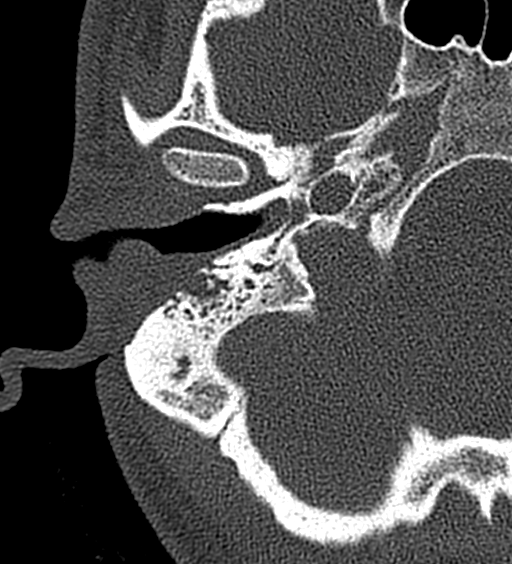
[im 42/125  bone]
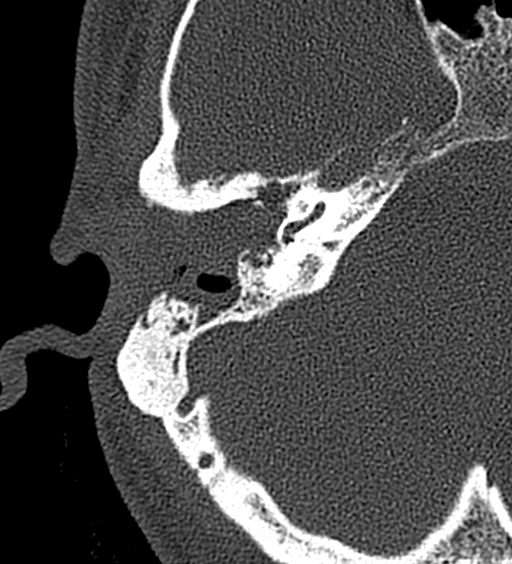
[im 52/125  brain]
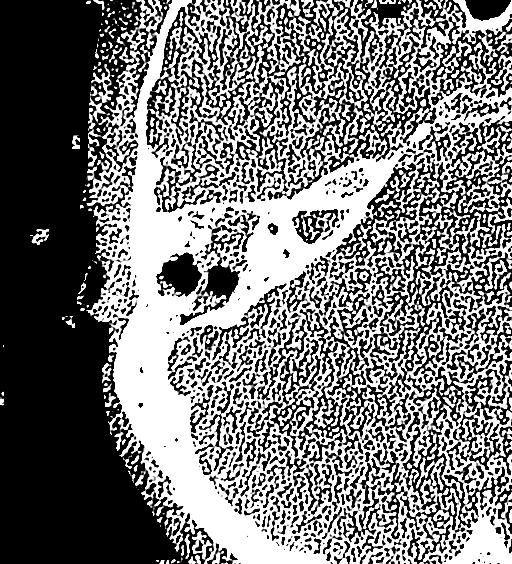
[im 52/125  bone]
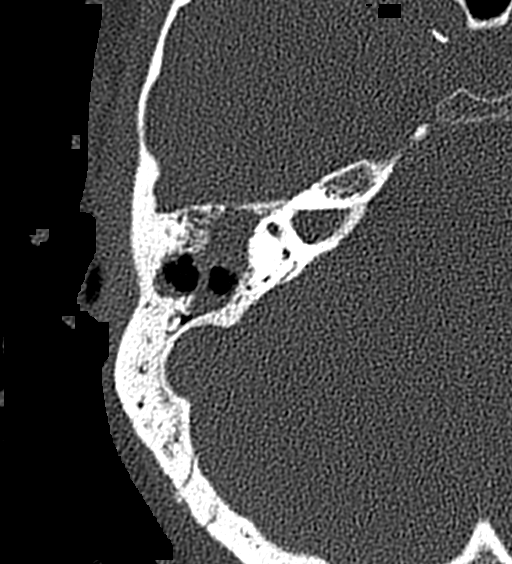
[im 63/125  bone]
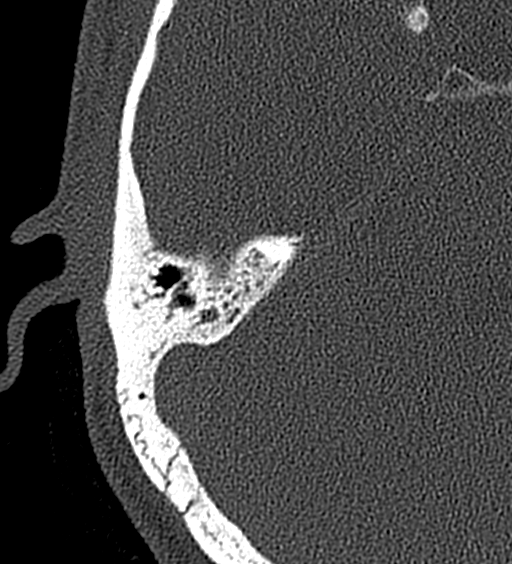
[im 73/125  bone]
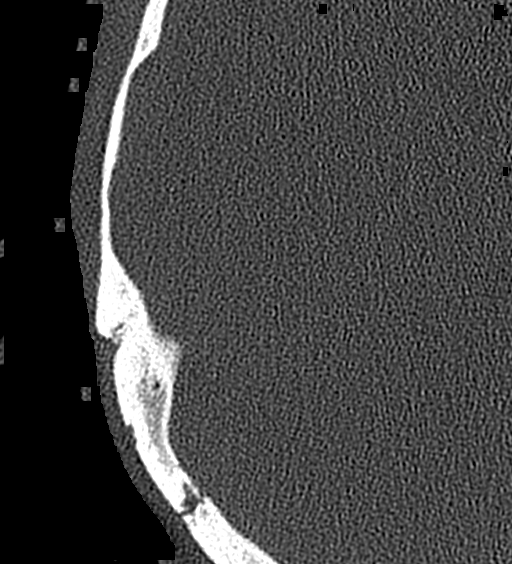
[im 83/125  bone]
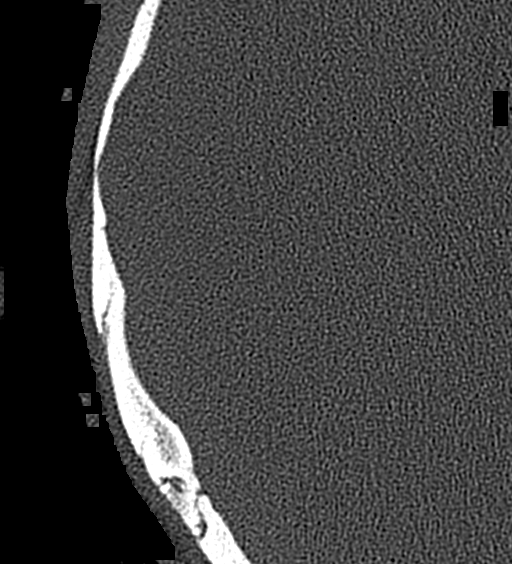
[im 94/125  brain]
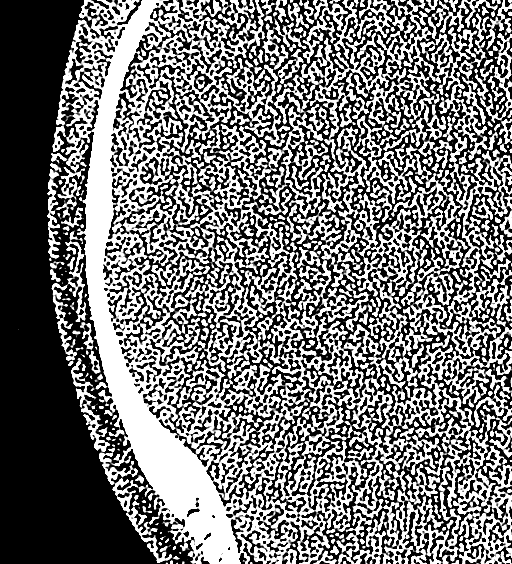
[im 94/125  bone]
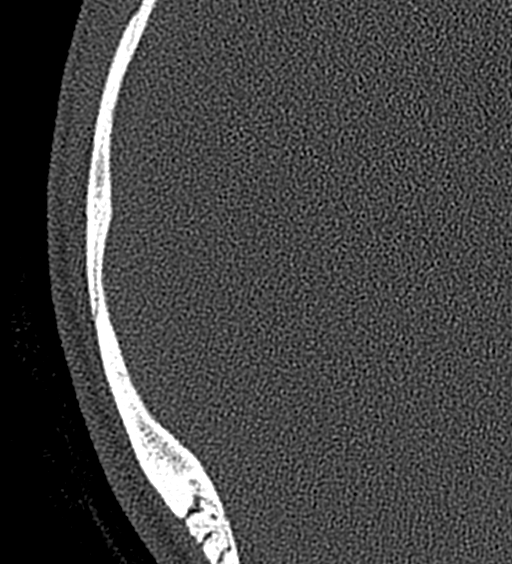
[im 104/125  bone]
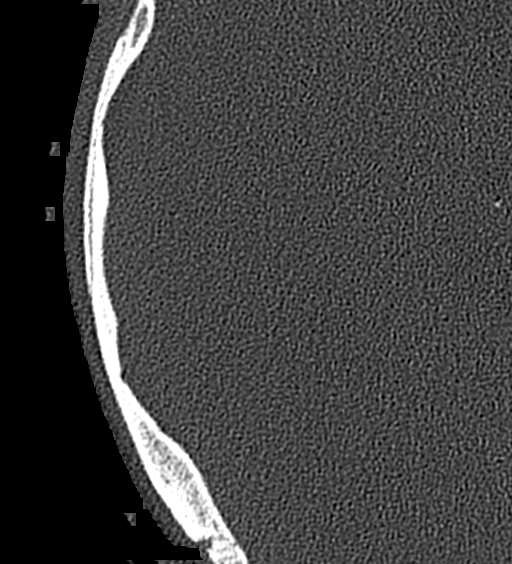
[im 114/125  bone]
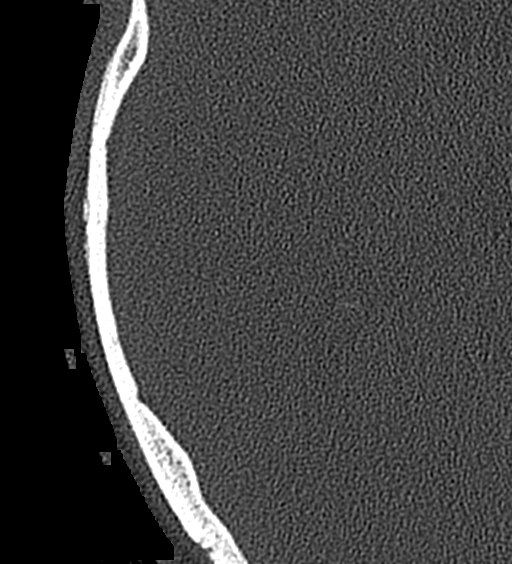

[13 of 40 positions shown; findings below may reference images not displayed]

FINDINGS: RIGHT: Sequelae of interval canal wall down mastoidectomy are
identified. The mastoidectomy bowl contains a combination of gas and
soft tissue. This soft tissue extends into and completely opacifies
the epitympanum. There is partial opacification of the middle ear
more inferiorly. The malleus is completely surrounded by soft tissue
but largely appears intact. The incus and stapes are not identified.
There is osseous thinning of the tegmen tympani with possible focal
dehiscence (for example series 6, image 110), and these changes are
new/ progressive compared to 9882. There is also new osseous
thinning and possible complete dehiscence involving the horizontal
semicircular canal anteriorly. The internal auditory canal, cochlea,
vestibule, and other semicircular canals are unremarkable.

LEFT: The external auditory canal and tympanic membrane are
unremarkable. The ossicles appear intact. The tympanic cavity is
clear. The internal auditory canal, cochlea, vestibule, and
semicircular canals are unremarkable. The mastoid air cells are
clear.

The visualized paranasal sinuses are clear. The visualized portion
of the brain is unremarkable. Visualized orbits are unremarkable.
IMPRESSION: Interval right mastoidectomy. Soft tissue in the mastoidectomy bowl
and right middle ear with possible dehiscence of the horizontal
semicircular canal and tegmen tympani concerning for recurrent
cholesteatoma. Nonvisualization of the incus and stapes may reflect
demineralization or erosion.

## 2022-08-15 ENCOUNTER — Emergency Department (HOSPITAL_COMMUNITY)
Admission: EM | Admit: 2022-08-15 | Discharge: 2022-08-15 | Disposition: A | Discharge status: Home / Self Care | Payer: BC Managed Care – PPO | Attending: Emergency Medicine | Admitting: Emergency Medicine

## 2022-08-15 ENCOUNTER — Other Ambulatory Visit: Payer: Self-pay

## 2022-08-15 ENCOUNTER — Encounter (HOSPITAL_COMMUNITY): Payer: Self-pay

## 2022-08-15 DIAGNOSIS — S51851A Open bite of right forearm, initial encounter: Secondary | ICD-10-CM | POA: Insufficient documentation

## 2022-08-15 DIAGNOSIS — W540XXA Bitten by dog, initial encounter: Secondary | ICD-10-CM | POA: Diagnosis not present

## 2022-08-15 MED ORDER — DOXYCYCLINE HYCLATE 100 MG PO CAPS: 100.0000 mg | ORAL_CAPSULE | Freq: Two times a day (BID) | ORAL | 0 refills | Status: AC

## 2022-08-15 MED ORDER — METRONIDAZOLE 500 MG PO TABS: 500.0000 mg | ORAL_TABLET | Freq: Three times a day (TID) | ORAL | 0 refills | Status: AC

## 2022-08-15 MED ORDER — LIDOCAINE-EPINEPHRINE (PF) 2 %-1:200000 IJ SOLN: 20.0000 mL | Freq: Once | INTRAMUSCULAR | Status: DC

## 2022-08-15 MED ORDER — TETANUS-DIPHTH-ACELL PERTUSSIS 5-2.5-18.5 LF-MCG/0.5 IM SUSY: 0.5000 mL | PREFILLED_SYRINGE | Freq: Once | INTRAMUSCULAR | Status: DC
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
## 2022-08-15 NOTE — ED Notes (Signed)
No bleeding at the 2 sites.  Pt refused tetanus and states he will go talk to dog owner anf if he wants rabies vaccine he will return.  PA aware.    Steri strips applied by PA to both sites   cover wit 2x2 and wrapped in kling.

## 2022-08-15 NOTE — ED Notes (Addendum)
2 punctures wounds on right lower forearm.  One anterior and other sl. Posterior.  No further bleeding.  Had pt cleanse with soap and warm water  animal control being notified by sec   pt moves right hand and fingers well with no problems
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
## 2022-08-15 NOTE — Discharge Instructions (Addendum)
You have been seen today for your complaint of dog bite. Your discharge medications include Augmentin. This is an antibiotic. You should take it as prescribed. You should take it for the entire duration of the prescription. This may cause an upset stomach. This is normal. You may take this with food. You may also eat yogurt to prevent diarrhea. Home care instructions are as follows:  Keep the area clean and dry until wounds are healed Follow up with: Your primary care provider in 1 week for reevaluation of your wounds Please seek immediate medical care if you develop any of the following symptoms: You have a red streak that leads away from your wound. You have non-clear fluid or more blood coming from your wound. There is pus or a bad smell coming from your wound. You have trouble moving your injured area. You have numbness or tingling that spreads beyond your wound. At this time there does not appear to be the presence of an emergent medical condition, however there is always the potential for conditions to change. Please read and follow the below instructions.  Do not take your medicine if  develop an itchy rash, swelling in your mouth or lips, or difficulty breathing; call 911 and seek immediate emergency medical attention if this occurs.  You may review your lab tests and imaging results in their entirety on your MyChart account.  Please discuss all results of fully with your primary care provider and other specialist at your follow-up visit.  Note: Portions of this text may have been transcribed using voice recognition software. Every effort was made to ensure accuracy; however, inadvertent computerized transcription errors may still be present.

## 2022-08-15 NOTE — ED Notes (Signed)
Wounds cleansed with wound cleaner.  No bleeding.
